# Patient Record
Sex: Male | Born: 1950 | Race: White | Hispanic: No | Marital: Married
Health system: Southern US, Community
[De-identification: ages and names within clinical notes are randomized; demographics above are authoritative.]

---

## 2014-04-08 ENCOUNTER — Other Ambulatory Visit: Payer: Self-pay | Admitting: Radiology

## 2014-04-08 ENCOUNTER — Other Ambulatory Visit (HOSPITAL_COMMUNITY): Payer: Self-pay | Admitting: Internal Medicine

## 2014-04-08 DIAGNOSIS — Z992 Dependence on renal dialysis: Principal | ICD-10-CM

## 2014-04-08 DIAGNOSIS — N186 End stage renal disease: Secondary | ICD-10-CM

## 2014-04-09 ENCOUNTER — Ambulatory Visit (HOSPITAL_COMMUNITY)
Admission: RE | Admit: 2014-04-09 | Discharge: 2014-04-09 | Disposition: A | Discharge status: Home / Self Care | Payer: Medicare Other | Source: Ambulatory Visit | Attending: Internal Medicine | Admitting: Internal Medicine

## 2014-04-09 ENCOUNTER — Other Ambulatory Visit (HOSPITAL_COMMUNITY): Payer: Self-pay | Admitting: Internal Medicine

## 2014-04-09 DIAGNOSIS — F0390 Unspecified dementia without behavioral disturbance: Secondary | ICD-10-CM | POA: Insufficient documentation

## 2014-04-09 DIAGNOSIS — N186 End stage renal disease: Secondary | ICD-10-CM

## 2014-04-09 DIAGNOSIS — G9341 Metabolic encephalopathy: Secondary | ICD-10-CM | POA: Insufficient documentation

## 2014-04-09 DIAGNOSIS — Z930 Tracheostomy status: Secondary | ICD-10-CM | POA: Insufficient documentation

## 2014-04-09 DIAGNOSIS — Z992 Dependence on renal dialysis: Secondary | ICD-10-CM | POA: Diagnosis not present

## 2014-04-09 DIAGNOSIS — I509 Heart failure, unspecified: Secondary | ICD-10-CM | POA: Diagnosis not present

## 2014-04-09 DIAGNOSIS — Y841 Kidney dialysis as the cause of abnormal reaction of the patient, or of later complication, without mention of misadventure at the time of the procedure: Secondary | ICD-10-CM | POA: Diagnosis not present

## 2014-04-09 DIAGNOSIS — T82598A Other mechanical complication of other cardiac and vascular devices and implants, initial encounter: Secondary | ICD-10-CM | POA: Diagnosis not present

## 2014-04-09 DIAGNOSIS — Z4901 Encounter for fitting and adjustment of extracorporeal dialysis catheter: Secondary | ICD-10-CM | POA: Insufficient documentation

## 2014-04-09 DIAGNOSIS — A0472 Enterocolitis due to Clostridium difficile, not specified as recurrent: Secondary | ICD-10-CM | POA: Diagnosis not present

## 2014-04-09 DIAGNOSIS — Z881 Allergy status to other antibiotic agents status: Secondary | ICD-10-CM | POA: Insufficient documentation

## 2014-04-09 MED ORDER — FENTANYL CITRATE 0.05 MG/ML IJ SOLN
INTRAMUSCULAR | Status: AC
  Filled 2014-04-09: qty 2

## 2014-04-09 MED ORDER — HEPARIN SODIUM (PORCINE) 1000 UNIT/ML IJ SOLN
INTRAMUSCULAR | Status: AC
  Filled 2014-04-09: qty 1

## 2014-04-09 MED ORDER — LIDOCAINE HCL 1 % IJ SOLN
INTRAMUSCULAR | Status: AC
  Filled 2014-04-09: qty 20

## 2014-04-09 MED ORDER — MIDAZOLAM HCL 2 MG/2ML IJ SOLN
INTRAMUSCULAR | Status: AC | PRN
  Administered 2014-04-09: 1 mg via INTRAVENOUS

## 2014-04-09 MED ORDER — VANCOMYCIN HCL IN DEXTROSE 1-5 GM/200ML-% IV SOLN
1000.0000 mg | Freq: Once | INTRAVENOUS | Status: AC
  Administered 2014-04-09: 1000 mg via INTRAVENOUS
  Filled 2014-04-09: qty 200

## 2014-04-09 MED ORDER — MIDAZOLAM HCL 2 MG/2ML IJ SOLN
INTRAMUSCULAR | Status: AC
  Filled 2014-04-09: qty 2

## 2014-04-09 MED ORDER — FENTANYL CITRATE 0.05 MG/ML IJ SOLN
INTRAMUSCULAR | Status: AC | PRN
  Administered 2014-04-09: 50 ug via INTRAVENOUS

## 2014-04-09 MED ORDER — CEFAZOLIN SODIUM-DEXTROSE 2-3 GM-% IV SOLR: 2.0000 g | INTRAVENOUS | Status: DC

## 2014-04-09 MED ORDER — SODIUM CHLORIDE 0.9 % IV SOLN: Freq: Once | INTRAVENOUS | Status: DC

## 2014-04-09 MED ORDER — CHLORHEXIDINE GLUCONATE 4 % EX LIQD
CUTANEOUS | Status: AC
  Filled 2014-04-09: qty 15

## 2014-04-09 MED ORDER — CEFAZOLIN SODIUM-DEXTROSE 2-3 GM-% IV SOLR
INTRAVENOUS | Status: AC
  Filled 2014-04-09: qty 50

## 2014-04-09 NOTE — H&P (Signed)
Arthur Weiss is an 63 y.o. male.   Chief Complaint: Pt lives at [facility] pt Metabolic encephalopathy Dementia +Cdiff right now ESRD Using Rt fem temp cath now for dialysis Malfunction/bleeding per facility Request made for IR to place tunnelled dialysis catheter - remove temp cath Dtr has consented for procedure via phone  HPI: encephalopathy; dementia; CHF; Cdiff; ESRD  No past medical history on file.  No past surgical history on file.  No family history on file. Social History:  has no tobacco, alcohol, and drug history on file.  Allergies: Allergies not on file   (Not in a hospital admission)  No results found for this or any previous visit (from the past 48 hour(s)). No results found.  Review of Systems  Constitutional: Positive for weight loss. Negative for fever.  Neurological: Positive for weakness.    There were no vitals taken for this visit. Physical Exam  Constitutional:  Thin; ill appearing Non verbal  Cardiovascular:  No murmur heard. Respiratory: Effort normal. He has no wheezes.  trach  Genitourinary:  Existing foley cath Bloody urine in collector  Musculoskeletal:  Rt femoral temp cath intact Some bleeding at site  Does not follow movement commands Non verbal Does seem to turn toward me when I call his name Was able to open mouth to command   Skin: Skin is warm and dry.     Assessment/Plan Temp dialysis catheter - Rt femoral Malfunction/bleeding per [facility] Now scheduled for new placement of Tunneled dialysis catheter Pts family aware of procedure benefits and risks and agreeable to proceed Consent obatined via phone  Arthur Weiss A 04/09/2014, 11:05 AM

## 2014-04-09 NOTE — Procedures (Signed)
LIJV HD catheter 27 cm Tip RA No comp

## 2014-04-09 NOTE — Progress Notes (Signed)
Patient arrived from [facility] at 1030, and placed on vent settings per [facility]. Ambo bag at bedside, but obturator did not come with patient. RT will continue to monitor during procedure.

## 2014-08-06 DEATH — deceased

## 2015-12-30 IMAGING — XA IR FLUORO GUIDE CV LINE*L*
1 series · 2 of 2 positions shown · non-contrast
Comparison: none

CLINICAL DATA: End-stage renal disease

[Series 1: run · 2 of 2 slices shown]
[im 1/2]
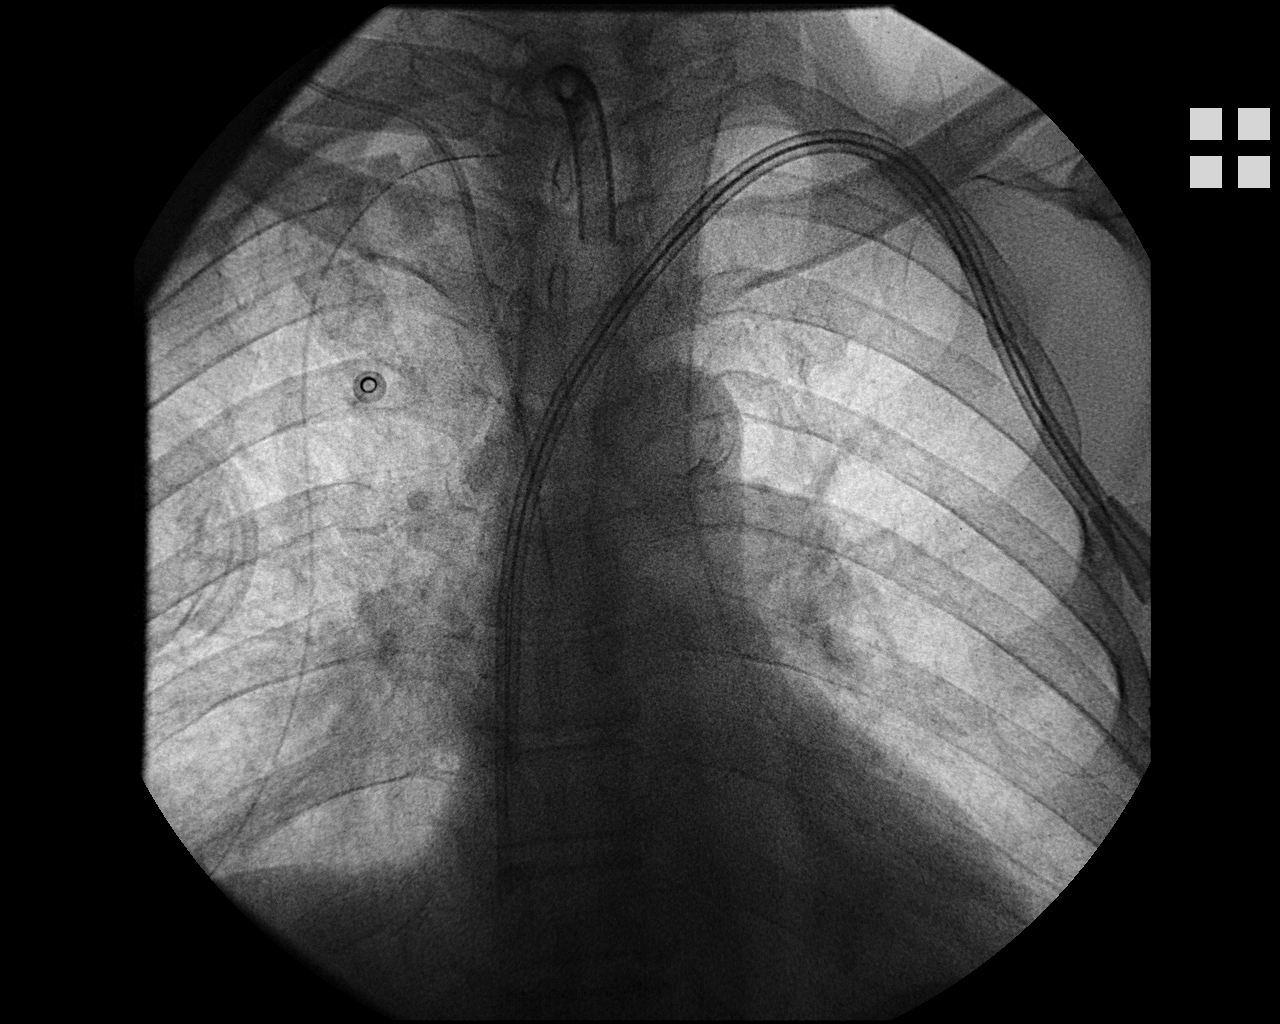
[im 2/2]
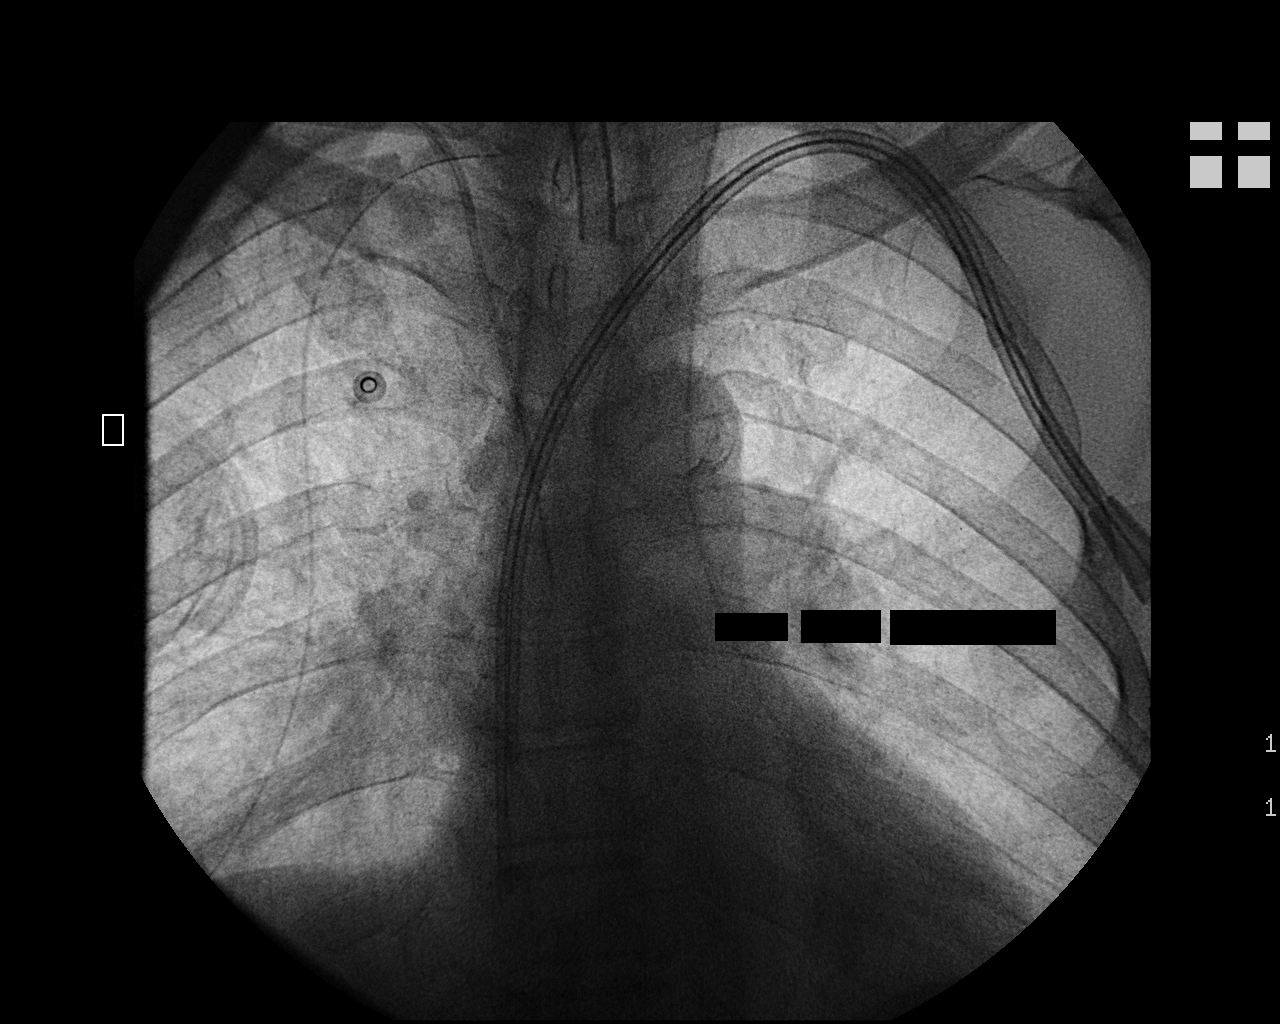

[2 of 2 positions shown; findings below may reference images not displayed]

EXAM:
TUNNELED DIALYSIS CATHETER PLACEMENT, ULTRASOUND GUIDANCE FOR
VASCULAR ACCESS

FLUOROSCOPY TIME:  48 seconds.

MEDICATIONS AND MEDICAL HISTORY:
Versed 1 mg, Fentanyl 50 mcg.

Vancomycin was given within two hours of incision. Vancomycin was
given due to an antibiotic allergy.

ANESTHESIA/SEDATION:
Moderate sedation time: 30 minutes

CONTRAST:  None

PROCEDURE:
The procedure, risks, benefits, and alternatives were explained to
the patient. Questions regarding the procedure were encouraged and
answered. The patient understands and consents to the procedure.

The left neck was prepped with Betadine in a sterile fashion, and a
sterile drape was applied covering the operative field. A sterile
gown and sterile gloves were used for the procedure. 1% lidocaine
into the skin and subcutaneous tissue. The left internal jugular
vein was noted to be patent initially with ultrasound. Under
sonographic guidance, a micropuncture needle was inserted into the
left IJ vein (Ultrasound and fluoroscopic image documentation was
performed). It was removed over an 018 wire which was upsized to an
Amplatz. This was advanced into the IVC.

A small incision was made in the left upper chest. The tunneling
device was utilized to advance the 27 centimeter tip to cuff
catheter from the chest incision and out the neck incision. A
peel-away sheath was advanced over the Amplatz wire. The leading
edge of the catheter was then advanced through the peel-away sheath.
The peel-away sheath was removed. It was flushed and instilled with
heparin. The chest incision was closed with a 0 Prolene pursestring
stitch. The neck incision was closed with a 4-0 Vicryl subcuticular
stitch. No complications.
FINDINGS: The image demonstrates placement of a tunneled dialysis catheter
with its tip in the right atrium.
IMPRESSION: Successful left IJ vein tunneled dialysis catheter with its tip in
the right atrium.
